# Patient Record
Sex: Male | Born: 1975 | Race: White | Hispanic: No | Marital: Single | Smoking: Former smoker
Health system: Southern US, Community
[De-identification: ages and names within clinical notes are randomized; demographics above are authoritative.]

---

## 2000-06-17 ENCOUNTER — Emergency Department (HOSPITAL_COMMUNITY): Admission: EM | Admit: 2000-06-17 | Discharge: 2000-06-17 | Payer: Self-pay | Admitting: Emergency Medicine

## 2000-06-17 ENCOUNTER — Encounter: Payer: Self-pay | Admitting: Emergency Medicine

## 2004-01-14 ENCOUNTER — Emergency Department (HOSPITAL_COMMUNITY): Admission: AD | Admit: 2004-01-14 | Discharge: 2004-01-14 | Payer: Self-pay | Admitting: Family Medicine

## 2004-11-08 ENCOUNTER — Emergency Department (HOSPITAL_COMMUNITY): Admission: EM | Admit: 2004-11-08 | Discharge: 2004-11-08 | Payer: Self-pay | Admitting: Family Medicine

## 2004-11-13 ENCOUNTER — Ambulatory Visit: Payer: Self-pay

## 2004-11-22 ENCOUNTER — Ambulatory Visit: Payer: Self-pay

## 2010-08-01 ENCOUNTER — Ambulatory Visit: Payer: Self-pay | Admitting: Family Medicine

## 2011-07-18 ENCOUNTER — Inpatient Hospital Stay (INDEPENDENT_AMBULATORY_CARE_PROVIDER_SITE_OTHER)
Admission: RE | Admit: 2011-07-18 | Discharge: 2011-07-18 | Disposition: A | Discharge status: Home / Self Care | Payer: Self-pay | Source: Ambulatory Visit | Attending: Family Medicine | Admitting: Family Medicine

## 2011-07-18 ENCOUNTER — Emergency Department (HOSPITAL_COMMUNITY): Payer: Self-pay

## 2011-07-18 ENCOUNTER — Emergency Department (HOSPITAL_COMMUNITY)
Admission: EM | Admit: 2011-07-18 | Discharge: 2011-07-19 | Disposition: A | Discharge status: Home / Self Care | Payer: Self-pay | Attending: Emergency Medicine | Admitting: Emergency Medicine

## 2011-07-18 DIAGNOSIS — K5900 Constipation, unspecified: Secondary | ICD-10-CM | POA: Insufficient documentation

## 2011-07-18 DIAGNOSIS — R1032 Left lower quadrant pain: Secondary | ICD-10-CM | POA: Insufficient documentation

## 2011-07-18 DIAGNOSIS — R10814 Left lower quadrant abdominal tenderness: Secondary | ICD-10-CM

## 2011-07-18 DIAGNOSIS — R197 Diarrhea, unspecified: Secondary | ICD-10-CM | POA: Insufficient documentation

## 2011-07-18 LAB — DIFFERENTIAL
Basophils Absolute: 0 10*3/uL (ref 0.0–0.1)
Basophils Relative: 0 % (ref 0–1)
Eosinophils Absolute: 0.2 10*3/uL (ref 0.0–0.7)
Eosinophils Relative: 1 % (ref 0–5)
Lymphocytes Relative: 25 % (ref 12–46)
Lymphs Abs: 2.8 10*3/uL (ref 0.7–4.0)
Monocytes Absolute: 1 10*3/uL (ref 0.1–1.0)
Monocytes Relative: 9 % (ref 3–12)
Neutro Abs: 7.2 10*3/uL (ref 1.7–7.7)
Neutrophils Relative %: 64 % (ref 43–77)

## 2011-07-18 LAB — URINALYSIS, ROUTINE W REFLEX MICROSCOPIC
Bilirubin Urine: NEGATIVE
Glucose, UA: NEGATIVE mg/dL
Hgb urine dipstick: NEGATIVE
Ketones, ur: 15 mg/dL — AB
Leukocytes, UA: NEGATIVE
Nitrite: NEGATIVE
Protein, ur: NEGATIVE mg/dL
Specific Gravity, Urine: 1.018 (ref 1.005–1.030)
Urobilinogen, UA: 0.2 mg/dL (ref 0.0–1.0)
pH: 5.5 (ref 5.0–8.0)

## 2011-07-18 LAB — CBC
HCT: 42.1 % (ref 39.0–52.0)
Hemoglobin: 15 g/dL (ref 13.0–17.0)
MCH: 33.3 pg (ref 26.0–34.0)
MCHC: 35.6 g/dL (ref 30.0–36.0)
MCV: 93.6 fL (ref 78.0–100.0)
Platelets: 265 10*3/uL (ref 150–400)
RBC: 4.5 MIL/uL (ref 4.22–5.81)
RDW: 11.8 % (ref 11.5–15.5)
WBC: 11.2 10*3/uL — ABNORMAL HIGH (ref 4.0–10.5)

## 2011-07-18 LAB — BASIC METABOLIC PANEL
BUN: 12 mg/dL (ref 6–23)
CO2: 28 mEq/L (ref 19–32)
Calcium: 9.6 mg/dL (ref 8.4–10.5)
Chloride: 102 mEq/L (ref 96–112)
Creatinine, Ser: 0.73 mg/dL (ref 0.50–1.35)
GFR calc Af Amer: >60 mL/min (ref >60)
GFR calc non Af Amer: >60 mL/min (ref >60)
Glucose, Bld: 87 mg/dL (ref 70–99)
Potassium: 4 mEq/L (ref 3.5–5.1)
Sodium: 140 mEq/L (ref 135–145)

## 2011-07-19 MED ORDER — IOHEXOL 300 MG/ML  SOLN
100.0000 mL | Freq: Once | INTRAMUSCULAR | Status: AC | PRN
  Administered 2011-07-19: 100 mL via INTRAVENOUS

## 2019-01-24 ENCOUNTER — Emergency Department (HOSPITAL_COMMUNITY)
Admission: EM | Admit: 2019-01-24 | Discharge: 2019-01-25 | Payer: No Typology Code available for payment source | Attending: Emergency Medicine | Admitting: Emergency Medicine

## 2019-01-24 ENCOUNTER — Other Ambulatory Visit: Payer: Self-pay

## 2019-01-24 ENCOUNTER — Encounter (HOSPITAL_COMMUNITY): Payer: Self-pay | Admitting: Emergency Medicine

## 2019-01-24 DIAGNOSIS — Z23 Encounter for immunization: Secondary | ICD-10-CM | POA: Insufficient documentation

## 2019-01-24 DIAGNOSIS — F10920 Alcohol use, unspecified with intoxication, uncomplicated: Secondary | ICD-10-CM

## 2019-01-24 DIAGNOSIS — S01111A Laceration without foreign body of right eyelid and periocular area, initial encounter: Secondary | ICD-10-CM | POA: Diagnosis not present

## 2019-01-24 DIAGNOSIS — Y9389 Activity, other specified: Secondary | ICD-10-CM | POA: Diagnosis not present

## 2019-01-24 DIAGNOSIS — S63501A Unspecified sprain of right wrist, initial encounter: Secondary | ICD-10-CM

## 2019-01-24 DIAGNOSIS — Y999 Unspecified external cause status: Secondary | ICD-10-CM | POA: Insufficient documentation

## 2019-01-24 DIAGNOSIS — S0990XA Unspecified injury of head, initial encounter: Secondary | ICD-10-CM

## 2019-01-24 DIAGNOSIS — S0181XA Laceration without foreign body of other part of head, initial encounter: Secondary | ICD-10-CM

## 2019-01-24 DIAGNOSIS — Y92410 Unspecified street and highway as the place of occurrence of the external cause: Secondary | ICD-10-CM | POA: Diagnosis not present

## 2019-01-24 DIAGNOSIS — S098XXA Other specified injuries of head, initial encounter: Secondary | ICD-10-CM | POA: Diagnosis present
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
## 2019-01-24 NOTE — ED Notes (Signed)
Bed: WTR6 Expected date:  Expected time:  Means of arrival:  Comments: 

## 2019-01-25 MED ORDER — TETANUS-DIPHTH-ACELL PERTUSSIS 5-2.5-18.5 LF-MCG/0.5 IM SUSP
0.5000 mL | Freq: Once | INTRAMUSCULAR | Status: AC
  Administered 2019-01-25: 0.5 mL via INTRAMUSCULAR
  Filled 2019-01-25: qty 0.5

## 2019-01-25 MED ORDER — LIDOCAINE-EPINEPHRINE (PF) 2 %-1:200000 IJ SOLN
10.0000 mL | Freq: Once | INTRAMUSCULAR | Status: AC
  Administered 2019-01-25: 20 mL via INTRADERMAL
  Filled 2019-01-25: qty 20

## 2019-01-25 NOTE — Discharge Instructions (Signed)
You may alternate Tylenol 1000 mg every 6 hours as needed for pain and Ibuprofen 800 mg every 8 hours as needed for pain.  Please take Ibuprofen with food.  We have offered you an x-ray of your right wrist.  You have declined x-rays today to evaluate for possible fracture.  If pain is not improving with rest, elevation, ice, Tylenol and ibuprofen, return to the emergency department, urgent care or your primary care physician for x-rays.
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
## 2019-03-27 ENCOUNTER — Emergency Department (HOSPITAL_COMMUNITY): Payer: Self-pay

## 2019-03-27 ENCOUNTER — Encounter (HOSPITAL_COMMUNITY): Payer: Self-pay

## 2019-03-27 ENCOUNTER — Other Ambulatory Visit: Payer: Self-pay

## 2019-03-27 ENCOUNTER — Emergency Department (HOSPITAL_COMMUNITY): Payer: Self-pay | Admitting: Anesthesiology

## 2019-03-27 ENCOUNTER — Encounter (HOSPITAL_COMMUNITY)
Admission: EM | Disposition: A | Discharge status: Home / Self Care | Payer: Self-pay | Source: Home / Self Care | Attending: Emergency Medicine

## 2019-03-27 ENCOUNTER — Ambulatory Visit (HOSPITAL_COMMUNITY)
Admission: EM | Admit: 2019-03-27 | Discharge: 2019-03-27 | Disposition: A | Discharge status: Home / Self Care | Payer: Self-pay | Attending: Surgery | Admitting: Surgery

## 2019-03-27 DIAGNOSIS — D176 Benign lipomatous neoplasm of spermatic cord: Secondary | ICD-10-CM | POA: Insufficient documentation

## 2019-03-27 DIAGNOSIS — Z87891 Personal history of nicotine dependence: Secondary | ICD-10-CM | POA: Insufficient documentation

## 2019-03-27 DIAGNOSIS — K4030 Unilateral inguinal hernia, with obstruction, without gangrene, not specified as recurrent: Secondary | ICD-10-CM | POA: Insufficient documentation

## 2019-03-27 DIAGNOSIS — Z79899 Other long term (current) drug therapy: Secondary | ICD-10-CM | POA: Insufficient documentation

## 2019-03-27 DIAGNOSIS — K4190 Unilateral femoral hernia, without obstruction or gangrene, not specified as recurrent: Secondary | ICD-10-CM | POA: Insufficient documentation

## 2019-03-27 DIAGNOSIS — K56609 Unspecified intestinal obstruction, unspecified as to partial versus complete obstruction: Secondary | ICD-10-CM

## 2019-03-27 DIAGNOSIS — Z791 Long term (current) use of non-steroidal anti-inflammatories (NSAID): Secondary | ICD-10-CM | POA: Insufficient documentation

## 2019-03-27 HISTORY — PX: INSERTION OF MESH: SHX5868

## 2019-03-27 HISTORY — PX: FEMORAL HERNIA REPAIR: SHX632

## 2019-03-27 HISTORY — PX: INGUINAL HERNIA REPAIR: SHX194

## 2019-03-27 HISTORY — PX: LAPAROSCOPIC TRANSEXTRAPERITONEAL INGUINAL HERNIA REPAIR: SUR801

## 2019-03-27 LAB — BASIC METABOLIC PANEL
Anion gap: 12 (ref 5–15)
BUN: 19 mg/dL (ref 6–20)
CO2: 24 mmol/L (ref 22–32)
Calcium: 9.1 mg/dL (ref 8.9–10.3)
Chloride: 104 mmol/L (ref 98–111)
Creatinine, Ser: 0.74 mg/dL (ref 0.61–1.24)
GFR calc Af Amer: >60 mL/min (ref >60)
GFR calc non Af Amer: >60 mL/min (ref >60)
Glucose, Bld: 155 mg/dL — ABNORMAL HIGH (ref 70–99)
Potassium: 3.8 mmol/L (ref 3.5–5.1)
Sodium: 140 mmol/L (ref 135–145)

## 2019-03-27 LAB — CBC WITH DIFFERENTIAL/PLATELET
Abs Immature Granulocytes: 0.05 10*3/uL (ref 0.00–0.07)
Basophils Absolute: 0 10*3/uL (ref 0.0–0.1)
Basophils Relative: 0 %
Eosinophils Absolute: 0 10*3/uL (ref 0.0–0.5)
Eosinophils Relative: 0 %
HCT: 40.1 % (ref 39.0–52.0)
Hemoglobin: 13.5 g/dL (ref 13.0–17.0)
Immature Granulocytes: 0 %
Lymphocytes Relative: 6 %
Lymphs Abs: 0.8 10*3/uL (ref 0.7–4.0)
MCH: 34.4 pg — ABNORMAL HIGH (ref 26.0–34.0)
MCHC: 33.7 g/dL (ref 30.0–36.0)
MCV: 102.3 fL — ABNORMAL HIGH (ref 80.0–100.0)
Monocytes Absolute: 0.6 10*3/uL (ref 0.1–1.0)
Monocytes Relative: 5 %
Neutro Abs: 10.5 10*3/uL — ABNORMAL HIGH (ref 1.7–7.7)
Neutrophils Relative %: 89 %
Platelets: 238 10*3/uL (ref 150–400)
RBC: 3.92 MIL/uL — ABNORMAL LOW (ref 4.22–5.81)
RDW: 11.6 % (ref 11.5–15.5)
WBC: 12 10*3/uL — ABNORMAL HIGH (ref 4.0–10.5)
nRBC: 0 % (ref 0.0–0.2)

## 2019-03-27 LAB — LACTIC ACID, PLASMA: Lactic Acid, Venous: 0.9 mmol/L (ref 0.5–1.9)

## 2019-03-27 SURGERY — REPAIR, HERNIA, INGUINAL, LAPAROSCOPIC
Anesthesia: General | Site: Abdomen | Laterality: Right

## 2019-03-27 MED ORDER — CEFAZOLIN SODIUM-DEXTROSE 2-4 GM/100ML-% IV SOLN
2.0000 g | INTRAVENOUS | Status: AC
  Administered 2019-03-27: 2 g via INTRAVENOUS
  Filled 2019-03-27: qty 100

## 2019-03-27 MED ORDER — ONDANSETRON 4 MG PO TBDP: 4.0000 mg | ORAL_TABLET | Freq: Four times a day (QID) | ORAL | Status: DC | PRN

## 2019-03-27 MED ORDER — TRAMADOL HCL 50 MG PO TABS: 50.0000 mg | ORAL_TABLET | Freq: Four times a day (QID) | ORAL | Status: DC | PRN

## 2019-03-27 MED ORDER — METRONIDAZOLE IN NACL 5-0.79 MG/ML-% IV SOLN
INTRAVENOUS | Status: AC
  Filled 2019-03-27: qty 100

## 2019-03-27 MED ORDER — 0.9 % SODIUM CHLORIDE (POUR BTL) OPTIME
TOPICAL | Status: DC | PRN
  Administered 2019-03-27: 1000 mL

## 2019-03-27 MED ORDER — SUCCINYLCHOLINE CHLORIDE 200 MG/10ML IV SOSY
PREFILLED_SYRINGE | INTRAVENOUS | Status: AC
  Filled 2019-03-27: qty 10

## 2019-03-27 MED ORDER — ONDANSETRON HCL 4 MG/2ML IJ SOLN
INTRAMUSCULAR | Status: AC
  Filled 2019-03-27: qty 2

## 2019-03-27 MED ORDER — DIPHENHYDRAMINE HCL 12.5 MG/5ML PO ELIX: 12.5000 mg | ORAL_SOLUTION | Freq: Four times a day (QID) | ORAL | Status: DC | PRN

## 2019-03-27 MED ORDER — SIMETHICONE 80 MG PO CHEW: 40.0000 mg | CHEWABLE_TABLET | Freq: Four times a day (QID) | ORAL | Status: DC | PRN

## 2019-03-27 MED ORDER — LACTATED RINGERS IV BOLUS: 1000.0000 mL | Freq: Three times a day (TID) | INTRAVENOUS | Status: DC | PRN

## 2019-03-27 MED ORDER — ROCURONIUM BROMIDE 10 MG/ML (PF) SYRINGE
PREFILLED_SYRINGE | INTRAVENOUS | Status: DC | PRN
  Administered 2019-03-27 (×2): 10 mg via INTRAVENOUS
  Administered 2019-03-27: 40 mg via INTRAVENOUS
  Administered 2019-03-27 (×2): 10 mg via INTRAVENOUS

## 2019-03-27 MED ORDER — LIDOCAINE 2% (20 MG/ML) 5 ML SYRINGE
INTRAMUSCULAR | Status: DC | PRN
  Administered 2019-03-27: 100 mg via INTRAVENOUS

## 2019-03-27 MED ORDER — POLYETHYLENE GLYCOL 3350 17 G PO PACK: 17.0000 g | PACK | Freq: Every day | ORAL | Status: DC | PRN

## 2019-03-27 MED ORDER — HYDROMORPHONE HCL 1 MG/ML IJ SOLN
1.0000 mg | Freq: Once | INTRAMUSCULAR | Status: AC
  Administered 2019-03-27: 1 mg via INTRAVENOUS
  Filled 2019-03-27: qty 1

## 2019-03-27 MED ORDER — PROPOFOL 10 MG/ML IV BOLUS
INTRAVENOUS | Status: AC
  Filled 2019-03-27: qty 20

## 2019-03-27 MED ORDER — SODIUM CHLORIDE 0.9% FLUSH: 3.0000 mL | Freq: Two times a day (BID) | INTRAVENOUS | Status: DC

## 2019-03-27 MED ORDER — SUGAMMADEX SODIUM 200 MG/2ML IV SOLN
INTRAVENOUS | Status: DC | PRN
  Administered 2019-03-27: 150 mg via INTRAVENOUS

## 2019-03-27 MED ORDER — STERILE WATER FOR IRRIGATION IR SOLN
Status: DC | PRN
  Administered 2019-03-27: 1000 mL

## 2019-03-27 MED ORDER — GABAPENTIN 300 MG PO CAPS
300.0000 mg | ORAL_CAPSULE | Freq: Two times a day (BID) | ORAL | Status: DC
  Administered 2019-03-27: 300 mg via ORAL
  Filled 2019-03-27: qty 1

## 2019-03-27 MED ORDER — METRONIDAZOLE IN NACL 5-0.79 MG/ML-% IV SOLN
500.0000 mg | Freq: Once | INTRAVENOUS | Status: AC
  Administered 2019-03-27: 500 mg via INTRAVENOUS
  Filled 2019-03-27: qty 100

## 2019-03-27 MED ORDER — METOPROLOL TARTRATE 5 MG/5ML IV SOLN: 5.0000 mg | Freq: Four times a day (QID) | INTRAVENOUS | Status: DC | PRN

## 2019-03-27 MED ORDER — IOHEXOL 300 MG/ML  SOLN
100.0000 mL | Freq: Once | INTRAMUSCULAR | Status: AC | PRN
  Administered 2019-03-27: 100 mL via INTRAVENOUS

## 2019-03-27 MED ORDER — ONDANSETRON HCL 4 MG/2ML IJ SOLN: 4.0000 mg | Freq: Four times a day (QID) | INTRAMUSCULAR | Status: DC | PRN

## 2019-03-27 MED ORDER — ENOXAPARIN SODIUM 40 MG/0.4ML ~~LOC~~ SOLN: 40.0000 mg | SUBCUTANEOUS | Status: DC

## 2019-03-27 MED ORDER — LACTATED RINGERS IV SOLN
INTRAVENOUS | Status: DC
  Administered 2019-03-27: 08:00:00 via INTRAVENOUS

## 2019-03-27 MED ORDER — HYDRALAZINE HCL 20 MG/ML IJ SOLN: 5.0000 mg | INTRAMUSCULAR | Status: DC | PRN

## 2019-03-27 MED ORDER — FENTANYL CITRATE (PF) 250 MCG/5ML IJ SOLN
INTRAMUSCULAR | Status: DC | PRN
  Administered 2019-03-27 (×4): 50 ug via INTRAVENOUS
  Administered 2019-03-27: 150 ug via INTRAVENOUS

## 2019-03-27 MED ORDER — SODIUM CHLORIDE 0.9% FLUSH: 3.0000 mL | INTRAVENOUS | Status: DC | PRN

## 2019-03-27 MED ORDER — BISACODYL 10 MG RE SUPP: 10.0000 mg | Freq: Every day | RECTAL | Status: DC | PRN

## 2019-03-27 MED ORDER — NAPROXEN 500 MG PO TABS: 500.0000 mg | ORAL_TABLET | Freq: Two times a day (BID) | ORAL | 1 refills | Status: AC | PRN

## 2019-03-27 MED ORDER — PROCHLORPERAZINE EDISYLATE 10 MG/2ML IJ SOLN: 5.0000 mg | Freq: Four times a day (QID) | INTRAMUSCULAR | Status: DC | PRN

## 2019-03-27 MED ORDER — ONDANSETRON HCL 4 MG/2ML IJ SOLN
4.0000 mg | Freq: Once | INTRAMUSCULAR | Status: AC
  Administered 2019-03-27: 4 mg via INTRAVENOUS
  Filled 2019-03-27: qty 2

## 2019-03-27 MED ORDER — HYDROMORPHONE HCL 1 MG/ML IJ SOLN
INTRAMUSCULAR | Status: AC
  Administered 2019-03-27: 12:00:00 1 mg via INTRAVENOUS
  Filled 2019-03-27: qty 1

## 2019-03-27 MED ORDER — ACETAMINOPHEN 500 MG PO TABS
1000.0000 mg | ORAL_TABLET | Freq: Three times a day (TID) | ORAL | Status: DC
  Administered 2019-03-27: 15:00:00 1000 mg via ORAL
  Filled 2019-03-27: qty 2

## 2019-03-27 MED ORDER — METHOCARBAMOL 500 MG PO TABS: 750.0000 mg | ORAL_TABLET | Freq: Four times a day (QID) | ORAL | Status: DC | PRN

## 2019-03-27 MED ORDER — MAGIC MOUTHWASH
15.0000 mL | Freq: Four times a day (QID) | ORAL | Status: DC | PRN
  Filled 2019-03-27: qty 15

## 2019-03-27 MED ORDER — PROPOFOL 10 MG/ML IV BOLUS
INTRAVENOUS | Status: DC | PRN
  Administered 2019-03-27: 140 mg via INTRAVENOUS

## 2019-03-27 MED ORDER — LACTATED RINGERS IR SOLN
Status: DC | PRN
  Administered 2019-03-27: 1000 mL

## 2019-03-27 MED ORDER — SUGAMMADEX SODIUM 200 MG/2ML IV SOLN
INTRAVENOUS | Status: AC
  Filled 2019-03-27: qty 2

## 2019-03-27 MED ORDER — HYDROMORPHONE HCL 1 MG/ML IJ SOLN
0.2500 mg | INTRAMUSCULAR | Status: DC | PRN
  Administered 2019-03-27 (×2): 0.5 mg via INTRAVENOUS

## 2019-03-27 MED ORDER — DEXAMETHASONE SODIUM PHOSPHATE 10 MG/ML IJ SOLN
INTRAMUSCULAR | Status: AC
  Filled 2019-03-27: qty 1

## 2019-03-27 MED ORDER — BUPIVACAINE LIPOSOME 1.3 % IJ SUSP
20.0000 mL | Freq: Once | INTRAMUSCULAR | Status: AC
  Administered 2019-03-27: 10:00:00 20 mL
  Filled 2019-03-27: qty 20

## 2019-03-27 MED ORDER — BUPIVACAINE-EPINEPHRINE 0.25% -1:200000 IJ SOLN
INTRAMUSCULAR | Status: DC | PRN
  Administered 2019-03-27: 60 mL

## 2019-03-27 MED ORDER — LIDOCAINE 2% (20 MG/ML) 5 ML SYRINGE
INTRAMUSCULAR | Status: AC
  Filled 2019-03-27: qty 5

## 2019-03-27 MED ORDER — BUPIVACAINE-EPINEPHRINE (PF) 0.25% -1:200000 IJ SOLN
INTRAMUSCULAR | Status: AC
  Filled 2019-03-27: qty 30

## 2019-03-27 MED ORDER — OXYCODONE HCL 5 MG PO TABS
5.0000 mg | ORAL_TABLET | ORAL | Status: DC | PRN
  Administered 2019-03-27: 5 mg via ORAL
  Filled 2019-03-27: qty 1

## 2019-03-27 MED ORDER — ROCURONIUM BROMIDE 10 MG/ML (PF) SYRINGE
PREFILLED_SYRINGE | INTRAVENOUS | Status: AC
  Filled 2019-03-27: qty 10

## 2019-03-27 MED ORDER — SUCCINYLCHOLINE CHLORIDE 200 MG/10ML IV SOSY
PREFILLED_SYRINGE | INTRAVENOUS | Status: DC | PRN
  Administered 2019-03-27: 120 mg via INTRAVENOUS

## 2019-03-27 MED ORDER — MIDAZOLAM HCL 2 MG/2ML IJ SOLN
INTRAMUSCULAR | Status: AC
  Filled 2019-03-27: qty 2

## 2019-03-27 MED ORDER — SODIUM CHLORIDE (PF) 0.9 % IJ SOLN
INTRAMUSCULAR | Status: AC
  Filled 2019-03-27: qty 50

## 2019-03-27 MED ORDER — MIDAZOLAM HCL 2 MG/2ML IJ SOLN
INTRAMUSCULAR | Status: DC | PRN
  Administered 2019-03-27: 2 mg via INTRAVENOUS

## 2019-03-27 MED ORDER — HYDROMORPHONE HCL 1 MG/ML IJ SOLN
1.0000 mg | Freq: Once | INTRAMUSCULAR | Status: AC
  Administered 2019-03-27: 04:00:00 1 mg via INTRAVENOUS
  Filled 2019-03-27: qty 1

## 2019-03-27 MED ORDER — HYDROMORPHONE HCL 1 MG/ML IJ SOLN
0.5000 mg | INTRAMUSCULAR | Status: DC | PRN
  Administered 2019-03-27: 12:00:00 1 mg via INTRAVENOUS
  Filled 2019-03-27: qty 1

## 2019-03-27 MED ORDER — DIPHENHYDRAMINE HCL 50 MG/ML IJ SOLN: 12.5000 mg | Freq: Four times a day (QID) | INTRAMUSCULAR | Status: DC | PRN

## 2019-03-27 MED ORDER — DEXAMETHASONE SODIUM PHOSPHATE 10 MG/ML IJ SOLN
INTRAMUSCULAR | Status: DC | PRN
  Administered 2019-03-27: 10 mg via INTRAVENOUS

## 2019-03-27 MED ORDER — FENTANYL CITRATE (PF) 250 MCG/5ML IJ SOLN
INTRAMUSCULAR | Status: AC
  Filled 2019-03-27: qty 5

## 2019-03-27 MED ORDER — ENALAPRILAT 1.25 MG/ML IV SOLN
0.6250 mg | Freq: Four times a day (QID) | INTRAVENOUS | Status: DC | PRN
  Filled 2019-03-27: qty 1

## 2019-03-27 MED ORDER — TRAMADOL HCL 50 MG PO TABS: 50.0000 mg | ORAL_TABLET | Freq: Four times a day (QID) | ORAL | 0 refills | Status: AC | PRN

## 2019-03-27 MED ORDER — PROCHLORPERAZINE MALEATE 10 MG PO TABS: 10.0000 mg | ORAL_TABLET | Freq: Four times a day (QID) | ORAL | Status: DC | PRN

## 2019-03-27 MED ORDER — SODIUM CHLORIDE 0.9 % IV SOLN: 250.0000 mL | INTRAVENOUS | Status: DC | PRN

## 2019-03-27 MED ORDER — FENTANYL CITRATE (PF) 100 MCG/2ML IJ SOLN
INTRAMUSCULAR | Status: AC
  Filled 2019-03-27: qty 2

## 2019-03-27 MED ORDER — LIP MEDEX EX OINT: 1.0000 "application " | TOPICAL_OINTMENT | Freq: Two times a day (BID) | CUTANEOUS | Status: DC

## 2019-03-27 MED ORDER — ONDANSETRON HCL 4 MG/2ML IJ SOLN
INTRAMUSCULAR | Status: DC | PRN
  Administered 2019-03-27: 4 mg via INTRAVENOUS

## 2019-03-27 SURGICAL SUPPLY — 40 items
APL PRP STRL LF DISP 70% ISPRP (MISCELLANEOUS) ×2
CABLE HIGH FREQUENCY MONO STRZ (ELECTRODE) ×4 IMPLANT
CHLORAPREP W/TINT 26 (MISCELLANEOUS) ×4 IMPLANT
COVER SURGICAL LIGHT HANDLE (MISCELLANEOUS) ×4 IMPLANT
COVER WAND RF STERILE (DRAPES) ×3 IMPLANT
DECANTER SPIKE VIAL GLASS SM (MISCELLANEOUS) ×4 IMPLANT
DEVICE SECURE STRAP 25 ABSORB (INSTRUMENTS) IMPLANT
DRAPE WARM FLUID 44X44 (DRAPE) ×4 IMPLANT
DRSG TEGADERM 2-3/8X2-3/4 SM (GAUZE/BANDAGES/DRESSINGS) ×8 IMPLANT
DRSG TEGADERM 4X4.75 (GAUZE/BANDAGES/DRESSINGS) ×4 IMPLANT
ELECT REM PT RETURN 15FT ADLT (MISCELLANEOUS) ×4 IMPLANT
GAUZE SPONGE 2X2 8PLY STRL LF (GAUZE/BANDAGES/DRESSINGS) ×2 IMPLANT
GLOVE ECLIPSE 8.0 STRL XLNG CF (GLOVE) ×4 IMPLANT
GLOVE INDICATOR 8.0 STRL GRN (GLOVE) ×4 IMPLANT
GOWN STRL REUS W/TWL XL LVL3 (GOWN DISPOSABLE) ×11 IMPLANT
IRRIG SUCT STRYKERFLOW 2 WTIP (MISCELLANEOUS) ×4
IRRIGATION SUCT STRKRFLW 2 WTP (MISCELLANEOUS) ×1 IMPLANT
KIT BASIN OR (CUSTOM PROCEDURE TRAY) ×4 IMPLANT
KIT TURNOVER KIT A (KITS) IMPLANT
MESH ULTRAPRO 6X6 15CM15CM (Mesh General) ×9 IMPLANT
NDL INSUFFLATION 14GA 120MM (NEEDLE) IMPLANT
NEEDLE INSUFFLATION 14GA 120MM (NEEDLE) IMPLANT
PAD POSITIONING PINK XL (MISCELLANEOUS) ×4 IMPLANT
SCISSORS LAP 5X35 DISP (ENDOMECHANICALS) ×4 IMPLANT
SET TUBE SMOKE EVAC HIGH FLOW (TUBING) ×4 IMPLANT
SLEEVE ADV FIXATION 5X100MM (TROCAR) ×1 IMPLANT
SLEEVE XCEL OPT CAN 5 100 (ENDOMECHANICALS) ×3 IMPLANT
SPONGE GAUZE 2X2 STER 10/PKG (GAUZE/BANDAGES/DRESSINGS) ×2
SUT MNCRL AB 4-0 PS2 18 (SUTURE) ×4 IMPLANT
SUT PDS AB 1 CT1 27 (SUTURE) ×8 IMPLANT
SUT VIC AB 2-0 SH 27 (SUTURE) ×4
SUT VIC AB 2-0 SH 27X BRD (SUTURE) ×1 IMPLANT
SUT VICRYL 0 UR6 27IN ABS (SUTURE) ×7 IMPLANT
TACKER 5MM HERNIA 3.5CML NAB (ENDOMECHANICALS) IMPLANT
TOWEL OR 17X26 10 PK STRL BLUE (TOWEL DISPOSABLE) ×4 IMPLANT
TOWEL OR NON WOVEN STRL DISP B (DISPOSABLE) ×4 IMPLANT
TRAY LAPAROSCOPIC (CUSTOM PROCEDURE TRAY) ×4 IMPLANT
TROCAR ADV FIXATION 5X100MM (TROCAR) ×1 IMPLANT
TROCAR XCEL BLUNT TIP 100MML (ENDOMECHANICALS) ×4 IMPLANT
TROCAR XCEL NON-BLD 5MMX100MML (ENDOMECHANICALS) ×3 IMPLANT

## 2019-03-27 NOTE — ED Notes (Signed)
Patient transported to CT 

## 2019-03-27 NOTE — ED Provider Notes (Signed)
[facility] DEPT Provider Note: Charles Spurling, MD, FACEP  CSN: [id number] MRN: [id number] ARRIVAL: 03/27/19 at [place]: [id number]   CHIEF COMPLAINT  Hernia   HISTORY OF PRESENT ILLNESS  03/27/19 1:59 AM Charles Deleon is a 43 y.o. male with a history of a right inguinal hernia for 9 years.  He tried to eat dinner yesterday evening about 8 PM but had a poor appetite.  His abdomen began hurting in about 9 PM he noticed severe pain in his right inguinal region with a tender mass.  This mass is much larger than the usual presentation of his hernia and he has not been able to reduce it.  He has had associated nausea but no vomiting.  He did have some diarrhea around 9:30 PM yesterday evening.  He states he has been exerting himself at work more vigorously than usual recently.   History reviewed. No pertinent past medical history.  History reviewed. No pertinent surgical history.  Family History  Problem Relation Age of Onset   Cancer Mother     Social History   Tobacco Use   Smoking status: Former Smoker   Smokeless tobacco: Never Used  Substance Use Topics   Alcohol use: Not Currently   Drug use: Yes    Types: Marijuana    Prior to Admission medications   Not on File    Allergies Patient has no known allergies.   REVIEW OF SYSTEMS  Negative except as noted here or in the History of Present Illness.   PHYSICAL EXAMINATION  Initial Vital Signs Blood pressure 137/81, pulse 63, temperature 97.7 F (36.5 C), temperature source Oral, resp. rate 18, height 5\' 10"  (1.778 m), weight 70.3 kg, SpO2 97 %.  Examination General: Well-developed, well-nourished male in no acute distress; appearance consistent with age of record HENT: normocephalic; well-healing laceration of medial right eyebrow Eyes: Normal appearance Neck: supple Heart: regular rate and rhythm Lungs: clear to auscultation bilaterally Abdomen: soft; nondistended; tender right inguinal mass;  bowel sounds hyperactive Extremities: No deformity; full range of motion; pulses normal Neurologic: Awake, alert and oriented; motor function intact in all extremities and symmetric; no facial droop Skin: Warm and dry Psychiatric: Grimacing   RESULTS  Summary of this visit's results, reviewed by myself:   EKG Interpretation  Date/Time:    Ventricular Rate:    PR Interval:    QRS Duration:   QT Interval:    QTC Calculation:   R Axis:     Text Interpretation:        Laboratory Studies: Results for orders placed or performed during the hospital encounter of 03/27/19 (from the past 24 hour(s))  CBC with Differential/Platelet     Status: Abnormal   Collection Time: 03/27/19  2:15 AM  Result Value Ref Range   WBC 12.0 (H) 4.0 - 10.5 K/uL   RBC 3.92 (L) 4.22 - 5.81 MIL/uL   Hemoglobin 13.5 13.0 - 17.0 g/dL   HCT 40.1 39.0 - 52.0 %   MCV 102.3 (H) 80.0 - 100.0 fL   MCH 34.4 (H) 26.0 - 34.0 pg   MCHC 33.7 30.0 - 36.0 g/dL   RDW 11.6 11.5 - 15.5 %   Platelets 238 150 - 400 K/uL   nRBC 0.0 0.0 - 0.2 %   Neutrophils Relative % 89 %   Neutro Abs 10.5 (H) 1.7 - 7.7 K/uL   Lymphocytes Relative 6 %   Lymphs Abs 0.8 0.7 - 4.0 K/uL   Monocytes Relative 5 %  Monocytes Absolute 0.6 0.1 - 1.0 K/uL   Eosinophils Relative 0 %   Eosinophils Absolute 0.0 0.0 - 0.5 K/uL   Basophils Relative 0 %   Basophils Absolute 0.0 0.0 - 0.1 K/uL   Immature Granulocytes 0 %   Abs Immature Granulocytes 0.05 0.00 - 0.07 K/uL  Basic metabolic panel     Status: Abnormal   Collection Time: 03/27/19  2:15 AM  Result Value Ref Range   Sodium 140 135 - 145 mmol/L   Potassium 3.8 3.5 - 5.1 mmol/L   Chloride 104 98 - 111 mmol/L   CO2 24 22 - 32 mmol/L   Glucose, Bld 155 (H) 70 - 99 mg/dL   BUN 19 6 - 20 mg/dL   Creatinine, Ser 0.74 0.61 - 1.24 mg/dL   Calcium 9.1 8.9 - 10.3 mg/dL   GFR calc non Af Amer >60 >60 mL/min   GFR calc Af Amer >60 >60 mL/min   Anion gap 12 5 - 15  Lactic acid, plasma      Status: None   Collection Time: 03/27/19  2:15 AM  Result Value Ref Range   Lactic Acid, Venous 0.9 0.5 - 1.9 mmol/L   Imaging Studies: Ct Abdomen Pelvis W Contrast  Result Date: 03/27/2019 CLINICAL DATA:  Mild scattered subsegmental atelectatic changes seen dependently within the visualized lung bases. Visualized lungs are otherwise clear. EXAM: CT ABDOMEN AND PELVIS WITH CONTRAST TECHNIQUE: Multidetector CT imaging of the abdomen and pelvis was performed using the standard protocol following bolus administration of intravenous contrast. CONTRAST:  128mL OMNIPAQUE IOHEXOL 300 MG/ML  SOLN COMPARISON:  Prior CT from 07/18/2011. FINDINGS: Lower chest: Mild scattered subsegmental atelectatic changes seen within the lung bases. Visualized lungs are otherwise clear. Hepatobiliary: Liver demonstrates a normal contrast enhanced appearance. Gallbladder within normal limits. No biliary dilatation. Pancreas: Pancreas within normal limits. Spleen: Spleen within normal limits. Adrenals/Urinary Tract: Adrenal glands are normal. Kidneys equal in size with symmetric enhancement. 12 mm cyst present within the interpolar right kidney. No nephrolithiasis, hydronephrosis, or focal enhancing renal mass. No hydroureter. Partially distended bladder within normal limits. Stomach/Bowel: Stomach within normal limits. Right inguinal hernia containing a short segment loop of small bowel (series 2, image 72). Associated free fluid present within the hernia sac, likely reactive. Small bowel mildly dilated proximally with associated air-fluid level, suggesting associated obstruction. Small bowel is decompressed distal to the hernia. Colon largely decompressed. Normal appendix. No other acute inflammatory changes seen about the bowels. Vascular/Lymphatic: Normal intravascular enhancement seen throughout the intra-abdominal aorta. Mesenteric vessels patent proximally. No adenopathy. Reproductive: Prostate normal. Other: No other free fluid  within the abdomen and pelvis. No free intraperitoneal air. Musculoskeletal: Few remotely healed right-sided rib fractures noted. No acute osseous abnormality. No discrete lytic or blastic osseous lesions. 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 IMPRESSION: 1. Right inguinal hernia containing a short segment of small bowel with associated small bowel obstruction as above. 2. No other acute intra-abdominal or pelvic process. Electronically Signed   By: Jeannine Boga M.D.   On: 03/27/2019 03:53    ED COURSE and MDM  Nursing notes and initial vitals signs, including pulse oximetry, reviewed.  Vitals:   03/27/19 0156 03/27/19 0157  BP: 137/81   Pulse: 63   Resp: 18   Temp: 97.7 F (36.5 C)   TempSrc: Oral   SpO2: 97%   Weight:  70.3 kg  Height:  5\' 10"  (1.778 m)   Discussed with Dr. Barry Dienes of  general surgery who will arrange for patient to be seen, with anticipation of surgery later this morning.  PROCEDURES    ED DIAGNOSES     ICD-10-CM   1. Inguinal hernia of right side with obstruction K40.30        Lulia Schriner, Jenny Reichmann, MD 03/27/19 [phone]

## 2019-03-27 NOTE — ED Triage Notes (Signed)
Patient coming from home with complaints of right groin swelling and pain. Patient states that he has had a hernia for the past 9 years, but recently exerted himself at work and has had increased swelling and pain starting around 10 PM tonight. Patient also states that he has had nausea and diarrhea starting tonight around 9:30 PM.

## 2019-03-27 NOTE — Anesthesia Procedure Notes (Signed)
Procedure Name: Intubation Date/Time: 03/27/2019 8:19 AM Performed by: Sharlette Dense, CRNA Patient Re-evaluated:Patient Re-evaluated prior to induction Oxygen Delivery Method: Circle system utilized Preoxygenation: Pre-oxygenation with 100% oxygen Induction Type: IV induction and Rapid sequence Laryngoscope Size: Miller and 3 Grade View: Grade III Tube type: Oral Tube size: 8.0 mm Number of attempts: 1 Airway Equipment and Method: Stylet Placement Confirmation: positive ETCO2,  ETT inserted through vocal cords under direct vision and breath sounds checked- equal and bilateral Secured at: 22 cm Tube secured with: Tape Dental Injury: Teeth and Oropharynx as per pre-operative assessment  Comments: Anterior with large epiglottis

## 2019-03-27 NOTE — Interval H&P Note (Signed)
History and Physical Interval Note:  03/27/2019 8:10 AM  [place]  has presented today for surgery, with the diagnosis of RIGHT INGUINAL HERNIA.  The various methods of treatment have been discussed with the patient and family. After consideration of risks, benefits and other options for treatment, the patient has consented to  Procedure(s): LAPAROSCOPIC RIGHT  INGUINAL HERNIA POSSIBLE OPEN (Right) as a surgical intervention.  The patient's history has been reviewed, patient examined, no change in status, stable for surgery.  I have reviewed the patient's chart and labs.  Questions were answered to the patient's satisfaction.    The anatomy & physiology of the abdominal wall and pelvic floor was discussed.  The pathophysiology of hernias in the inguinal and pelvic region was discussed.  Natural history risks such as progressive enlargement, pain, incarceration, and strangulation was discussed.   Contributors to complications such as smoking, obesity, diabetes, prior surgery, etc were discussed.    I feel the risks of no intervention will lead to serious problems that outweigh the operative risks; therefore, I recommended surgery to reduce and repair the hernia.  I explained laparoscopic techniques with possible need for an open approach.  I noted usual use of mesh to patch and/or buttress hernia repair  Risks such as bleeding, infection, abscess, need for further treatment, injury to other organs, need for repair of tissues / organs, stroke, heart attack, death, and other risks were discussed.  I noted a good likelihood this will help address the problem.   Goals of post-operative recovery were discussed as well.  Possibility that this will not correct all symptoms was explained.  I stressed the importance of low-impact activity, aggressive pain control, avoiding constipation, & not pushing through pain to minimize risk of post-operative chronic pain or injury. Possibility of reherniation was  discussed.  We will work to minimize complications.     An educational handout further explaining the pathology & treatment options was given as well.  Questions were answered.  The patient expresses understanding & wishes to proceed with surgery.  I have re-reviewed the the patient's records, history, medications, and allergies.  I have re-examined the patient.  I again discussed intraoperative plans and goals of post-operative recovery.  The patient agrees to proceed.  Charles Deleon [place]  25-Dec-1975 [id number]  Patient Care Team: Patient, No Pcp Per as PCP - General (General Practice)  Patient Active Problem List   Diagnosis Date Noted  . Incarcerated right inguinal hernia 03/27/2019  . SBO (small bowel obstruction) ([place]) 03/27/2019    History reviewed. No pertinent past medical history.  History reviewed. No pertinent surgical history.  Social History   Socioeconomic History  . Marital status: Single    Spouse name: Not on file  . Number of children: Not on file  . Years of education: Not on file  . Highest education level: Not on file  Occupational History  . Not on file  Social Needs  . Financial resource strain: Not on file  . Food insecurity:    Worry: Not on file    Inability: Not on file  . Transportation needs:    Medical: Not on file    Non-medical: Not on file  Tobacco Use  . Smoking status: Former Research scientist (life sciences)  . Smokeless tobacco: Never Used  Substance and Sexual Activity  . Alcohol use: Not Currently  . Drug use: Yes    Types: Marijuana    Comment: 1 joint a day  . Sexual activity: Not on file  Lifestyle  . Physical activity:    Days per week: Not on file    Minutes per session: Not on file  . Stress: Not on file  Relationships  . Social connections:    Talks on phone: Not on file    Gets together: Not on file    Attends religious service: Not on file    Active member of club or organization: Not on file    Attends meetings of clubs or  organizations: Not on file    Relationship status: Not on file  . Intimate partner violence:    Fear of current or ex partner: Not on file    Emotionally abused: Not on file    Physically abused: Not on file    Forced sexual activity: Not on file  Other Topics Concern  . Not on file  Social History Narrative  . Not on file    Family History  Problem Relation Age of Onset  . Cancer Mother     Medications Prior to Admission  Medication Sig Dispense Refill Last Dose  . acidophilus (RISAQUAD) CAPS capsule Take 1 capsule by mouth daily.   03/26/2019 at Unknown time  . ibuprofen (ADVIL,MOTRIN) 200 MG tablet Take 800 mg by mouth every 6 (six) hours as needed for moderate pain.   03/26/2019 at Unknown time  . Multiple Vitamin (MULTIVITAMIN WITH MINERALS) TABS tablet Take 1 tablet by mouth daily.   03/26/2019 at Unknown time    Current Facility-Administered Medications  Medication Dose Route Frequency Provider Last Rate Last Dose  . [MAR Hold] ceFAZolin (ANCEF) IVPB 2g/100 mL premix  2 g Intravenous On Call to Elizabeth, MD      . lactated ringers infusion   Intravenous Continuous Suzette Battiest, MD 10 mL/hr at 03/27/19 0801    . sodium chloride (PF) 0.9 % injection              No Known Allergies  BP 132/76   Pulse (!) 57   Temp 98.7 F (37.1 C) (Oral)   Resp 18   Ht 5\' 10"  (1.778 m)   Wt 70.3 kg   SpO2 97%   BMI 22.24 kg/m   Labs: Results for orders placed or performed during the hospital encounter of 03/27/19 (from the past 48 hour(s))  CBC with Differential/Platelet     Status: Abnormal   Collection Time: 03/27/19  2:15 AM  Result Value Ref Range   WBC 12.0 (H) 4.0 - 10.5 K/uL   RBC 3.92 (L) 4.22 - 5.81 MIL/uL   Hemoglobin 13.5 13.0 - 17.0 g/dL   HCT 40.1 39.0 - 52.0 %   MCV 102.3 (H) 80.0 - 100.0 fL   MCH 34.4 (H) 26.0 - 34.0 pg   MCHC 33.7 30.0 - 36.0 g/dL   RDW 11.6 11.5 - 15.5 %   Platelets 238 150 - 400 K/uL   nRBC 0.0 0.0 - 0.2 %   Neutrophils  Relative % 89 %   Neutro Abs 10.5 (H) 1.7 - 7.7 K/uL   Lymphocytes Relative 6 %   Lymphs Abs 0.8 0.7 - 4.0 K/uL   Monocytes Relative 5 %   Monocytes Absolute 0.6 0.1 - 1.0 K/uL   Eosinophils Relative 0 %   Eosinophils Absolute 0.0 0.0 - 0.5 K/uL   Basophils Relative 0 %   Basophils Absolute 0.0 0.0 - 0.1 K/uL   Immature Granulocytes 0 %   Abs Immature Granulocytes 0.05 0.00 - 0.07 K/uL    Comment:  Performed at [facility], [place] [address] [place], [place] [address]  Basic metabolic panel     Status: Abnormal   Collection Time: 03/27/19  2:15 AM  Result Value Ref Range   Sodium 140 135 - 145 mmol/L   Potassium 3.8 3.5 - 5.1 mmol/L   Chloride 104 98 - 111 mmol/L   CO2 24 22 - 32 mmol/L   Glucose, Bld 155 (H) 70 - 99 mg/dL   BUN 19 6 - 20 mg/dL   Creatinine, Ser 0.74 0.61 - 1.24 mg/dL   Calcium 9.1 8.9 - 10.3 mg/dL   GFR calc non Af Amer >60 >60 mL/min   GFR calc Af Amer >60 >60 mL/min   Anion gap 12 5 - 15    Comment: Performed at [facility], [place] [address], [place], [place] [address]  Lactic acid, plasma     Status: None   Collection Time: 03/27/19  2:15 AM  Result Value Ref Range   Lactic Acid, Venous 0.9 0.5 - 1.9 mmol/L    Comment: Performed at [facility], [place] [address], [place], [place] [address]    Imaging / Studies: Ct Abdomen Pelvis W Contrast  Result Date: 03/27/2019 CLINICAL DATA:  Mild scattered subsegmental atelectatic changes seen dependently within the visualized lung bases. Visualized lungs are otherwise clear. EXAM: CT ABDOMEN AND PELVIS WITH CONTRAST TECHNIQUE: Multidetector CT imaging of the abdomen and pelvis was performed using the standard protocol following bolus administration of intravenous contrast. CONTRAST:  145mL OMNIPAQUE IOHEXOL 300 MG/ML  SOLN COMPARISON:  Prior CT from 07/18/2011. FINDINGS: Lower chest: Mild scattered subsegmental atelectatic changes seen within the lung bases.  Visualized lungs are otherwise clear. Hepatobiliary: Liver demonstrates a normal contrast enhanced appearance. Gallbladder within normal limits. No biliary dilatation. Pancreas: Pancreas within normal limits. Spleen: Spleen within normal limits. Adrenals/Urinary Tract: Adrenal glands are normal. Kidneys equal in size with symmetric enhancement. 12 mm cyst present within the interpolar right kidney. No nephrolithiasis, hydronephrosis, or focal enhancing renal mass. No hydroureter. Partially distended bladder within normal limits. Stomach/Bowel: Stomach within normal limits. Right inguinal hernia containing a short segment loop of small bowel (series 2, image 72). Associated free fluid present within the hernia sac, likely reactive. Small bowel mildly dilated proximally with associated air-fluid level, suggesting associated obstruction. Small bowel is decompressed distal to the hernia. Colon largely decompressed. Normal appendix. No other acute inflammatory changes seen about the bowels. Vascular/Lymphatic: Normal intravascular enhancement seen throughout the intra-abdominal aorta. Mesenteric vessels patent proximally. No adenopathy. Reproductive: Prostate normal. Other: No other free fluid within the abdomen and pelvis. No free intraperitoneal air. Musculoskeletal: Few remotely healed right-sided rib fractures noted. No acute osseous abnormality. No discrete lytic or blastic osseous lesions. 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 IMPRESSION: 1. Right inguinal hernia containing a short segment of small bowel with associated small bowel obstruction as above. 2. No other acute intra-abdominal or pelvic process. Electronically Signed   By: Jeannine Boga M.D.   On: 03/27/2019 03:53     .Adin Hector, M.D., F.A.C.S. Gastrointestinal and Minimally Invasive Surgery [facility] [address], [place], [place] [address] [phone] Main / Paging  03/27/2019 8:10 AM    Adin Hector

## 2019-03-27 NOTE — Op Note (Signed)
03/27/2019  10:13 AM  PATIENT:  Charles Deleon  43 y.o. male  Patient Care Team: Patient, No Pcp Per as PCP - General (General Practice)  PRE-OPERATIVE DIAGNOSIS:  INCARCERATED RIGHT INGUINAL HERNIA   POST-OPERATIVE DIAGNOSIS:   INCARCERATED RIGHT INGUINAL HERNIA LEFT FEMORAL HERNIA  PROCEDURE:   DIAGNOSTIC LAPAROSCOPY LAPAROSCOPIC RIGHT INGUINAL AND LEFT FEMORAL HERNIA REPAIRS WITH MESH  BILATERAL TAP BLOCKS INSERTION OF MESH  SURGEON:  Adin Hector, MD  ASSISTANT: None  ANESTHESIA:     Regional ilioinguinal and genitofemoral and spermatic cord nerve blocks  General  Nerve block provided with liposomal bupivacaine (Experel) mixed with 0.25% bupivacaine as a Bilateral TAP block x 33mL each side at the level of the transverse abdominis & preperitoneal spaces along the flank at the anterior axillary line, from subcostal ridge to iliac crest under laparoscopic guidance    EBL:  No intake/output data recorded..  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  INDICATION: Pleasant active male with intermittent right groin swelling for the past decade.  Suspected hernia.  Reducible.  Required some occasional reduction.  However he had an episode where it popped out and cannot be reduced.  Increasing pain and swelling.  Examination concerning for incarcerated inguinal hernia.  CT scan confirmed a knuckle of small bowel within it starting to cause a small bowel obstruction.  Not reducible in the emergency department.  Recommendation made for urgent operative exploration and hernia repair.  Possible small bowel resection.  The anatomy & physiology of the abdominal wall and pelvic floor was  discussed.  The pathophysiology of hernias in the inguinal and pelvic region was discussed.  Natural history risks such as progressive enlargement, pain, incarceration & strangulation was discussed.   Contributors to complications such as smoking, obesity, diabetes, prior surgery, etc were discussed.    I feel the risks of no intervention will lead to serious problems that outweigh the operative risks; therefore, I recommended surgery to reduce and repair the hernia.  I explained laparoscopic techniques with possible need for an open approach.  I noted usual use of mesh to patch and/or buttress hernia repair  Risks such as bleeding, infection, abscess, need for further treatment, heart attack, death, and other risks were discussed.  I noted a good likelihood this will help address the problem.   Goals of post-operative recovery were discussed as well.  Possibility that this will not correct all symptoms was explained.  I stressed the importance of low-impact activity, aggressive pain control, avoiding constipation, & not pushing through pain to minimize risk of post-operative chronic pain or injury.  Possible need for small bowel resection if evidence of any ischemia or perforation.  Possibility of suture repair or absorbable mesh with suboptimal hernia recurrence rate but low risk of infection needed versus use of permanent mesh if no evidence of any infection or inflammation or necrosis.  Recommendation made for diagnostic laparoscopy to rule out hernia on contralateral side.  He notes some occasional discomfort there but not to the level on the right.  We will work to minimize complications.     An educational handout further explaining the pathology & treatment options was given as well.  Questions were answered.  The patient expresses understanding & wishes to proceed with surgery.  OR FINDINGS: Patient had indirect greater than indirect pantaloon type inguinal hernia on the right side.  Small bowel  reduced without any evidence of necrosis or perforation.  No cellulitis or abscess.  Mild laxity at the femoral and obturator canals but no true hernias there.  On the left side some laxity at the left internal ring but no true indirect inguinal hernia.  No direct space hernia.  Obvious left femoral hernia.  No obturator hernia.  DESCRIPTION:  The patient was identified & brought into the operating room. The patient was positioned supine with arms tucked. SCDs were active during the entire case. The patient underwent general anesthesia without any difficulty.  The abdomen was prepped and draped in a sterile fashion.   A Surgical Timeout confirmed our plan.  The patient's hernia had self reduced by the time of the abdomen and groins being prepped.  I made a transverse incision through the inferior umbilical fold.  I made a small transverse nick through the anterior rectus fascia contralateral to the inguinal hernia side and placed a 0-vicryl stitch through the fascia.  I placed a Hasson trocar into the preperitoneal plane.  Entry was clean.  We induced carbon dioxide insufflation. Camera inspection revealed no injury.  I used a 49mm angled scope to bluntly free the peritoneum off the infraumbilical anterior abdominal wall.  I created enough of a preperitoneal pocket to place 26mm ports into the right & left mid-abdomen into this preperitoneal cavity.  I focused attention on the RIGHT pelvis since that was the dominant hernia side.   I used blunt & focused sharp dissection to free the peritoneum off the flank and down to the pubic rim.  I freed the anteriolateral bladder wall off the anteriolateral pelvic wall, sparing midline attachments.   I located a swath of peritoneum going into a hernia fascial defect at the  direct space consistent with  a direct space inguinal hernia.   Also hernia sac going up into a dilated internal ring consistent with an indirect inguinal hernia.  Therefore a pantaloon type  hernia.  About already been reduced.  The indirect hernia was greater than the direct space hernia..  I gradually freed the peritoneal hernia sac off safely and reduced it into the preperitoneal space.  I freed the peritoneum off the spermatic vessels & vas deferens.  I freed peritoneum off the retroperitoneum along the psoas muscle.  Patient did not have much in the way of any spermatic cord lipomas requiring dissection or removal.  I did open up the hernia sac and ran the small bowel.  There is some adhesions to the peritoneum in the midline but there is no evidence of any ischemia.  Appendix not inflamed.  Cecum normal as well.  No evidence of any perforation necrosis nor ischemia.  No evidence of any cellulitis or major peritonitis or inflammation.  Hemostasis good.  I closed the peritoneal defect and thus did a hernia sac high ligation using 2-0 Vicryl intracorporeal running suture laparoscopically.  Hemostasis was good.    I turned attention on the opposite  LEFT pelvis.  Patient had obvious left femoral hernia.  I did dissection in a similar, mirror-image fashion. The patient had a femoral hernia.   Some laxity in the internal ring but no definite indirect inguinal hernia.  However the patient did have a rather large spermatic cord lipoma.  Spermatic cord lipoma was  dissected away & removed.  No direct space nor obturator hernias.    I checked & assured hemostasis.    Because there was no evidence of cellulitis, peritonitis, ischemia, perforation, necrosis; I felt it was reasonable to go ahead and repair with standard mesh.  My partner, Dr. Lucia Gaskins, came in and agreed as well.  I chose 15x15 cm sheets of ultra-lightweight polypropylene mesh (Ultrapro), one for each side.  I cut a single sigmoid-shaped slit ~6cm from a corner of each mesh.  I placed the meshes into the preperitoneal space & laid them as overlapping diamonds such that at the inferior points, a 6x6 cm corner flap rested in the true  anterolateral pelvis, covering the obturator & femoral foramina.   I allowed the bladder to return to the pubis, this helping tuck the corners of the mesh in the anteriolateral pelvis.  The medial corners overlapped each other across midline cephalad to the pubic rim.   Given the numerous hernias of moderate size, I placed a third 15x15cm mesh in the center as a vertical diamond.  The lateral wings of the mesh overlap across the direct spaces and internal rings where the dominant hernias were.  This provided good coverage and reinforcement of the hernia repairs.  Because of the central mesh placement with good overlap, I did not place any tacks.   I held the hernia sacs cephalad & evacuated carbon dioxide.  I closed the fascia with absorbable suture.  I closed the skin using 4-0 monocryl stitch.  Sterile dressings were applied.   The patient was extubated & arrived in the PACU in stable condition..  I had discussed postoperative care with the patient in the holding area.  Instructions are written in the chart.  I made an attempt to reach his mother, Shankar Silber [phone], but there was no answer.  Because there was no major obstruction nor necrosis, he could potentially leave later today as he hoped.  We will see.  May need to keep him overnight until family can be reached & disposition planning made.  I will tentatively write orders to allow him to leave later this evening if he is feeling better and family can be reached.  Would plan outpatient follow-up with telephone postoperative call with [facility] given the COVID-19 pandemic.  Adin Hector, M.D., F.A.C.S. Gastrointestinal and Minimally Invasive Surgery [facility] [address] [place],  [address] [phone] Main / Paging  03/27/2019 10:13 AM

## 2019-03-27 NOTE — Anesthesia Preprocedure Evaluation (Addendum)
Anesthesia Evaluation  Patient identified by MRN, date of birth, ID band Patient awake    Reviewed: Allergy & Precautions, H&P , NPO status , Patient's Chart, lab work & pertinent test results  Airway Mallampati: II  TM Distance: >3 FB Neck ROM: Full    Dental no notable dental hx. (+) Teeth Intact, Dental Advisory Given   Pulmonary neg pulmonary ROS, former smoker,    Pulmonary exam normal breath sounds clear to auscultation       Cardiovascular negative cardio ROS   Rhythm:Regular Rate:Normal     Neuro/Psych negative neurological ROS  negative psych ROS   GI/Hepatic negative GI ROS, Neg liver ROS,   Endo/Other  negative endocrine ROS  Renal/GU negative Renal ROS  negative genitourinary   Musculoskeletal   Abdominal   Peds  Hematology negative hematology ROS (+)   Anesthesia Other Findings   Reproductive/Obstetrics negative OB ROS                            Anesthesia Physical Anesthesia Plan  ASA: I  Anesthesia Plan: General   Post-op Pain Management:    Induction: Intravenous  PONV Risk Score and Plan: 3 and Ondansetron, Dexamethasone and Midazolam  Airway Management Planned: Oral ETT  Additional Equipment:   Intra-op Plan:   Post-operative Plan: Extubation in OR  Informed Consent: I have reviewed the patients History and Physical, chart, labs and discussed the procedure including the risks, benefits and alternatives for the proposed anesthesia with the patient or authorized representative who has indicated his/her understanding and acceptance.     Dental advisory given  Plan Discussed with: CRNA  Anesthesia Plan Comments:         Anesthesia Quick Evaluation

## 2019-03-27 NOTE — Plan of Care (Signed)
  Problem: Education: Goal: Knowledge of General Education information will improve Description Including pain rating scale, medication(s)/side effects and non-pharmacologic comfort measures 03/27/2019 1905 by Audrea Muscat, RN Outcome: Adequate for Discharge 03/27/2019 1616 by Audrea Muscat, RN Outcome: Progressing   Problem: Health Behavior/Discharge Planning: Goal: Ability to manage health-related needs will improve 03/27/2019 1905 by Audrea Muscat, RN Outcome: Adequate for Discharge 03/27/2019 1616 by Audrea Muscat, RN Outcome: Progressing   Problem: Clinical Measurements: Goal: Ability to maintain clinical measurements within normal limits will improve 03/27/2019 1905 by Audrea Muscat, RN Outcome: Adequate for Discharge 03/27/2019 1616 by Audrea Muscat, RN Outcome: Progressing Goal: Will remain free from infection 03/27/2019 1905 by Audrea Muscat, RN Outcome: Adequate for Discharge 03/27/2019 1616 by Audrea Muscat, RN Outcome: Progressing Goal: Diagnostic test results will improve 03/27/2019 1905 by Audrea Muscat, RN Outcome: Adequate for Discharge 03/27/2019 1616 by Audrea Muscat, RN Outcome: Progressing Goal: Respiratory complications will improve 03/27/2019 1905 by Audrea Muscat, RN Outcome: Adequate for Discharge 03/27/2019 1616 by Audrea Muscat, RN Outcome: Progressing Goal: Cardiovascular complication will be avoided 03/27/2019 1905 by Audrea Muscat, RN Outcome: Adequate for Discharge 03/27/2019 1616 by Audrea Muscat, RN Outcome: Progressing   Problem: Activity: Goal: Risk for activity intolerance will decrease 03/27/2019 1905 by Audrea Muscat, RN Outcome: Adequate for Discharge 03/27/2019 1616 by Audrea Muscat, RN Outcome: Progressing   Problem: Nutrition: Goal: Adequate nutrition will be maintained 03/27/2019 1905 by Audrea Muscat, RN Outcome: Adequate for Discharge 03/27/2019 1616 by Audrea Muscat, RN Outcome:  Progressing   Problem: Coping: Goal: Level of anxiety will decrease 03/27/2019 1905 by Audrea Muscat, RN Outcome: Adequate for Discharge 03/27/2019 1616 by Audrea Muscat, RN Outcome: Progressing   Problem: Elimination: Goal: Will not experience complications related to bowel motility 03/27/2019 1905 by Audrea Muscat, RN Outcome: Adequate for Discharge 03/27/2019 1616 by Audrea Muscat, RN Outcome: Progressing Goal: Will not experience complications related to urinary retention 03/27/2019 1905 by Audrea Muscat, RN Outcome: Adequate for Discharge 03/27/2019 1616 by Audrea Muscat, RN Outcome: Progressing   Problem: Pain Managment: Goal: General experience of comfort will improve 03/27/2019 1905 by Audrea Muscat, RN Outcome: Adequate for Discharge 03/27/2019 1616 by Audrea Muscat, RN Outcome: Progressing   Problem: Safety: Goal: Ability to remain free from injury will improve 03/27/2019 1905 by Audrea Muscat, RN Outcome: Adequate for Discharge 03/27/2019 1616 by Audrea Muscat, RN Outcome: Progressing   Problem: Skin Integrity: Goal: Risk for impaired skin integrity will decrease 03/27/2019 1905 by Audrea Muscat, RN Outcome: Adequate for Discharge 03/27/2019 1616 by Audrea Muscat, RN Outcome: Progressing  Home with family. Instructions given

## 2019-03-27 NOTE — ED Notes (Signed)
Pt resting in bed with eyes closed. Reports significant relief from discomfort. Waiting to speak to surgery. No acute distress

## 2019-03-27 NOTE — Plan of Care (Signed)
  Problem: Education: Goal: Knowledge of General Education information will improve Description Including pain rating scale, medication(s)/side effects and non-pharmacologic comfort measures Outcome: Progressing   Problem: Health Behavior/Discharge Planning: Goal: Ability to manage health-related needs will improve Outcome: Progressing   Problem: Clinical Measurements: Goal: Ability to maintain clinical measurements within normal limits will improve Outcome: Progressing Goal: Will remain free from infection Outcome: Progressing Goal: Diagnostic test results will improve Outcome: Progressing Goal: Respiratory complications will improve Outcome: Progressing Goal: Cardiovascular complication will be avoided Outcome: Progressing   Problem: Activity: Goal: Risk for activity intolerance will decrease Outcome: Progressing   Problem: Nutrition: Goal: Adequate nutrition will be maintained Outcome: Progressing   Problem: Coping: Goal: Level of anxiety will decrease Outcome: Progressing   Problem: Elimination: Goal: Will not experience complications related to bowel motility Outcome: Progressing Goal: Will not experience complications related to urinary retention Outcome: Progressing   Problem: Pain Managment: Goal: General experience of comfort will improve Outcome: Progressing   Problem: Safety: Goal: Ability to remain free from injury will improve Outcome: Progressing   Problem: Skin Integrity: Goal: Risk for impaired skin integrity will decrease Outcome: Progressing    Plan of care discussed with the patient

## 2019-03-27 NOTE — ED Notes (Signed)
Surgery at bedside consulting with pt. No acute distress.

## 2019-03-27 NOTE — Anesthesia Postprocedure Evaluation (Signed)
Anesthesia Post Note  Patient: Charles Deleon  Procedure(s) Performed: DIAGNOSTIC LAPAROSCOPY LAPAROSCOPIC RIGHT INGUINAL AND LEFT FEMORALHERNIA REPAIRS WITH MESH BILATERAL TAP BLOCKS (Right Abdomen) INSERTION OF MESH (Bilateral Groin)     Patient location during evaluation: PACU Anesthesia Type: General Level of consciousness: awake and alert Pain management: pain level controlled Vital Signs Assessment: post-procedure vital signs reviewed and stable Respiratory status: spontaneous breathing, nonlabored ventilation, respiratory function stable and patient connected to nasal cannula oxygen Cardiovascular status: blood pressure returned to baseline and stable Postop Assessment: no apparent nausea or vomiting Anesthetic complications: no    Last Vitals:  Vitals:   03/27/19 1022 03/27/19 1045  BP: (!) 159/87 (!) 141/77  Pulse: (!) 101 76  Resp: 11 14  Temp: 36.7 C 36.7 C  SpO2: 100% 100%    Last Pain:  Vitals:   03/27/19 1045  TempSrc:   PainSc: 2                  Yena Tisby,W. EDMOND

## 2019-03-27 NOTE — Transfer of Care (Signed)
Immediate Anesthesia Transfer of Care Note  Patient: Charles Deleon  Procedure(s) Performed: DIAGNOSTIC LAPAROSCOPY LAPAROSCOPIC RIGHT INGUINAL AND LEFT FEMORALHERNIA REPAIRS WITH MESH BILATERAL TAP BLOCKS (Right Abdomen) INSERTION OF MESH (Bilateral Groin)  Patient Location: PACU  Anesthesia Type:General  Level of Consciousness: awake and alert   Airway & Oxygen Therapy: Patient Spontanous Breathing and Patient connected to face mask oxygen  Post-op Assessment: Report given to RN and Post -op Vital signs reviewed and stable  Post vital signs: Reviewed and stable  Last Vitals:  Vitals Value Taken Time  BP 159/87 03/27/2019 10:22 AM  Temp    Pulse 100 03/27/2019 10:23 AM  Resp 11 03/27/2019 10:23 AM  SpO2 100 % 03/27/2019 10:23 AM  Vitals shown include unvalidated device data.  Last Pain:  Vitals:   03/27/19 0751  TempSrc:   PainSc: 3          Complications: No apparent anesthesia complications

## 2019-03-27 NOTE — H&P (Signed)
Charles Deleon is an 43 y.o. male.   Chief Complaint: incarcerated RIH HPI:  Patient is a 43 year old male who presented to the ER with complaints of right inguinal hernia that was stuck in his groin and swollen and severely painful.  He has had a hernia for 9 years.  Typically it will pop out and go back in easily.  It has had a few times where it has stuck but then when he rests or lays down it will go back in.  This time it did not and got more swollen and painful.  He denies any nausea or vomiting.  The pain actually has improved and since coming to the emergency department the swelling has reduced by about half or more.  He denies fevers and chills.  He has not had any surgery before.  Of note the patient is at high risk for colorectal cancer.  He works in Biomedical scientist.  He stopped drinking around 2 months ago.    History reviewed. No pertinent past medical history.  History reviewed. No pertinent surgical history.  Family History  Problem Relation Age of Onset  . Cancer Mother    Social History:  reports that he has quit smoking. He has never used smokeless tobacco. He reports previous alcohol use. He reports current drug use. Drug: Marijuana.  Allergies: No Known Allergies  Meds:   Current Meds  Medication Sig  . acidophilus (RISAQUAD) CAPS capsule Take 1 capsule by mouth daily.  Marland Kitchen ibuprofen (ADVIL,MOTRIN) 200 MG tablet Take 800 mg by mouth every 6 (six) hours as needed for moderate pain.  . Multiple Vitamin (MULTIVITAMIN WITH MINERALS) TABS tablet Take 1 tablet by mouth daily.     Results for orders placed or performed during the hospital encounter of 03/27/19 (from the past 48 hour(s))  CBC with Differential/Platelet     Status: Abnormal   Collection Time: 03/27/19  2:15 AM  Result Value Ref Range   WBC 12.0 (H) 4.0 - 10.5 K/uL   RBC 3.92 (L) 4.22 - 5.81 MIL/uL   Hemoglobin 13.5 13.0 - 17.0 g/dL   HCT 40.1 39.0 - 52.0 %   MCV 102.3 (H) 80.0 - 100.0 fL   MCH 34.4 (H)  26.0 - 34.0 pg   MCHC 33.7 30.0 - 36.0 g/dL   RDW 11.6 11.5 - 15.5 %   Platelets 238 150 - 400 K/uL   nRBC 0.0 0.0 - 0.2 %   Neutrophils Relative % 89 %   Neutro Abs 10.5 (H) 1.7 - 7.7 K/uL   Lymphocytes Relative 6 %   Lymphs Abs 0.8 0.7 - 4.0 K/uL   Monocytes Relative 5 %   Monocytes Absolute 0.6 0.1 - 1.0 K/uL   Eosinophils Relative 0 %   Eosinophils Absolute 0.0 0.0 - 0.5 K/uL   Basophils Relative 0 %   Basophils Absolute 0.0 0.0 - 0.1 K/uL   Immature Granulocytes 0 %   Abs Immature Granulocytes 0.05 0.00 - 0.07 K/uL    Comment: Performed at [facility], [place] [address] [place], [place] [address]  Basic metabolic panel     Status: Abnormal   Collection Time: 03/27/19  2:15 AM  Result Value Ref Range   Sodium 140 135 - 145 mmol/L   Potassium 3.8 3.5 - 5.1 mmol/L   Chloride 104 98 - 111 mmol/L   CO2 24 22 - 32 mmol/L   Glucose, Bld 155 (H) 70 - 99 mg/dL   BUN 19 6 - 20  mg/dL   Creatinine, Ser 0.74 0.61 - 1.24 mg/dL   Calcium 9.1 8.9 - 10.3 mg/dL   GFR calc non Af Amer >60 >60 mL/min   GFR calc Af Amer >60 >60 mL/min   Anion gap 12 5 - 15    Comment: Performed at [facility], [place] [address] [place], [place] [address]  Lactic acid, plasma     Status: None   Collection Time: 03/27/19  2:15 AM  Result Value Ref Range   Lactic Acid, Venous 0.9 0.5 - 1.9 mmol/L    Comment: Performed at [facility], [place] [address], [place], [place] [address]   Ct Abdomen Pelvis W Contrast  Result Date: 03/27/2019 CLINICAL DATA:  Mild scattered subsegmental atelectatic changes seen dependently within the visualized lung bases. Visualized lungs are otherwise clear. EXAM: CT ABDOMEN AND PELVIS WITH CONTRAST TECHNIQUE: Multidetector CT imaging of the abdomen and pelvis was performed using the standard protocol following bolus administration of intravenous contrast. CONTRAST:  113mL OMNIPAQUE IOHEXOL 300 MG/ML  SOLN COMPARISON:  Prior CT from  07/18/2011. FINDINGS: Lower chest: Mild scattered subsegmental atelectatic changes seen within the lung bases. Visualized lungs are otherwise clear. Hepatobiliary: Liver demonstrates a normal contrast enhanced appearance. Gallbladder within normal limits. No biliary dilatation. Pancreas: Pancreas within normal limits. Spleen: Spleen within normal limits. Adrenals/Urinary Tract: Adrenal glands are normal. Kidneys equal in size with symmetric enhancement. 12 mm cyst present within the interpolar right kidney. No nephrolithiasis, hydronephrosis, or focal enhancing renal mass. No hydroureter. Partially distended bladder within normal limits. Stomach/Bowel: Stomach within normal limits. Right inguinal hernia containing a short segment loop of small bowel (series 2, image 72). Associated free fluid present within the hernia sac, likely reactive. Small bowel mildly dilated proximally with associated air-fluid level, suggesting associated obstruction. Small bowel is decompressed distal to the hernia. Colon largely decompressed. Normal appendix. No other acute inflammatory changes seen about the bowels. Vascular/Lymphatic: Normal intravascular enhancement seen throughout the intra-abdominal aorta. Mesenteric vessels patent proximally. No adenopathy. Reproductive: Prostate normal. Other: No other free fluid within the abdomen and pelvis. No free intraperitoneal air. Musculoskeletal: Few remotely healed right-sided rib fractures noted. No acute osseous abnormality. No discrete lytic or blastic osseous lesions. 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 IMPRESSION: 1. Right inguinal hernia containing a short segment of small bowel with associated small bowel obstruction as above. 2. No other acute intra-abdominal or pelvic process. Electronically Signed   By: Jeannine Boga M.D.   On: 03/27/2019 03:53    Review of Systems  Constitutional: Negative.   HENT: Negative.   Eyes:  Negative.   Respiratory: Negative.   Cardiovascular: Negative.   Gastrointestinal: Negative.   Genitourinary:       Right groin pain and swelling  Musculoskeletal: Negative.   Skin: Negative.   Neurological: Negative.   Endo/Heme/Allergies: Negative.   Psychiatric/Behavioral: Negative.   All other systems reviewed and are negative.   Blood pressure 119/63, pulse (!) 59, temperature 97.9 F (36.6 C), temperature source Oral, resp. rate 16, height 5\' 10"  (1.778 m), weight 70.3 kg, SpO2 97 %. Physical Exam  Constitutional: He is oriented to person, place, and time. He appears well-developed and well-nourished. No distress.  HENT:  Head: Normocephalic and atraumatic.  Right Ear: External ear normal.  Left Ear: External ear normal.  Healed scar over right medial eyebrow.  Eyes: Pupils are equal, round, and reactive to light. Conjunctivae are normal. No scleral icterus.  Neck: Normal  range of motion. Neck supple. No tracheal deviation present.  Cardiovascular: Normal rate and regular rhythm.  Respiratory: Effort normal. No respiratory distress.  GI: Soft. He exhibits no distension. There is no abdominal tenderness. There is no rebound and no guarding. A hernia is present. Hernia confirmed positive in the right inguinal area.  Genitourinary:    Penis normal.      Musculoskeletal: Normal range of motion.        General: No tenderness, deformity or edema.  Neurological: He is alert and oriented to person, place, and time.  Skin: Skin is warm and dry. No rash noted. He is not diaphoretic. No erythema. No pallor.  Psychiatric: He has a normal mood and affect. His behavior is normal. Judgment and thought content normal.     Assessment/Plan Incarcerated RIH with bowel.   Radiologic bowel obstruction, though no clinical signs of obstruction.    Will need operative repair.   Dr. Johney Maine to do.  Will discuss with him lap vs open.    Briefly discussed with patient.   Will need to have no  heavy lifting or strenuous activity post op.     Stark Klein, MD 03/27/2019, 6:56 AM

## 2019-03-27 NOTE — ED Notes (Signed)
Pt resting in bed. Awaiting CT results. Meds given per MAR. NAD

## 2019-03-30 ENCOUNTER — Encounter (HOSPITAL_COMMUNITY): Payer: Self-pay | Admitting: Surgery

## 2019-06-29 IMAGING — CT CT ABDOMEN AND PELVIS WITH CONTRAST
2 of 5 series · 15 of 46 positions shown, 17 images · IV contrast (omnipaque)
Comparison: Prior CT from 07/18/2011.

CLINICAL DATA: Mild scattered subsegmental atelectatic changes seen
dependently within the visualized lung bases. Visualized lungs are
otherwise clear.

EXAM:
CT ABDOMEN AND PELVIS WITH CONTRAST
TECHNIQUE: Multidetector CT imaging of the abdomen and pelvis was performed
using the standard protocol following bolus administration of
intravenous contrast.
CONTRAST:  100mL OMNIPAQUE IOHEXOL 300 MG/ML  SOLN

[Series 2: axial st · axial · 0.70mm/px · z∈[-247,+153]mm · 12 of 92 slices shown, 14 images]
[im 6/92  soft-tissue]
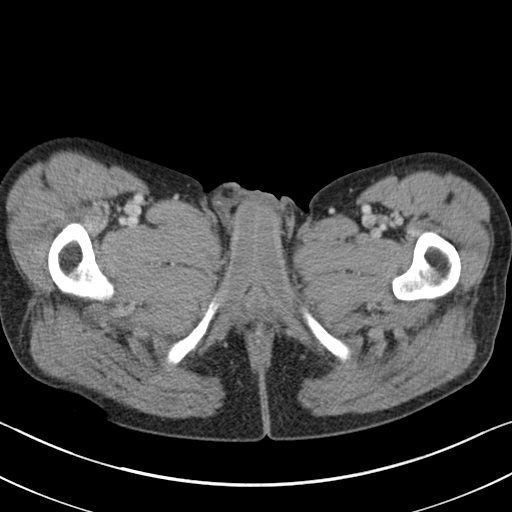
[im 6/92  bone]
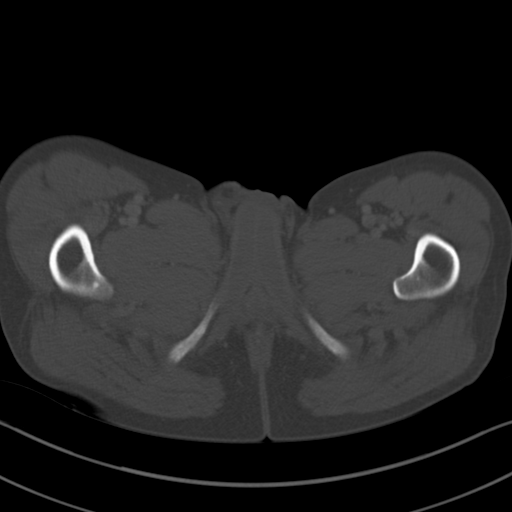
[im 17/92  soft-tissue]
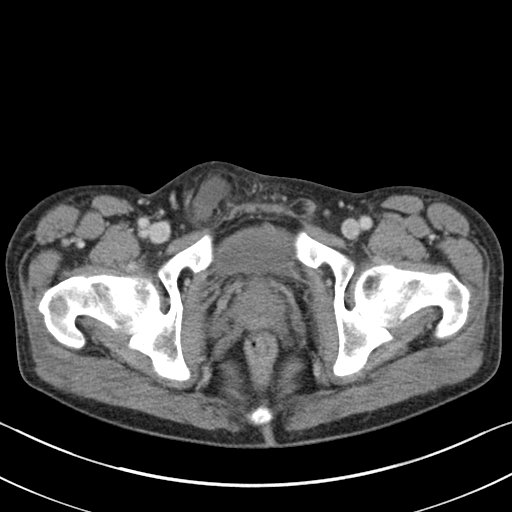
[im 22/92  soft-tissue]
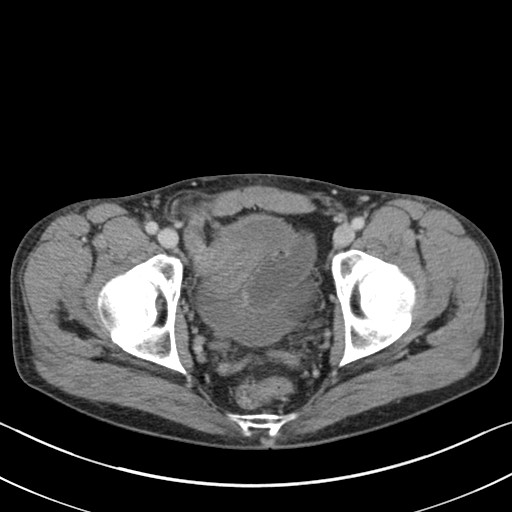
[im 27/92  soft-tissue]
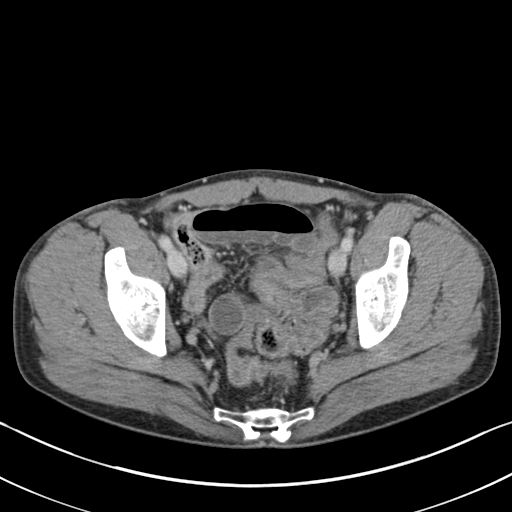
[im 38/92  soft-tissue]
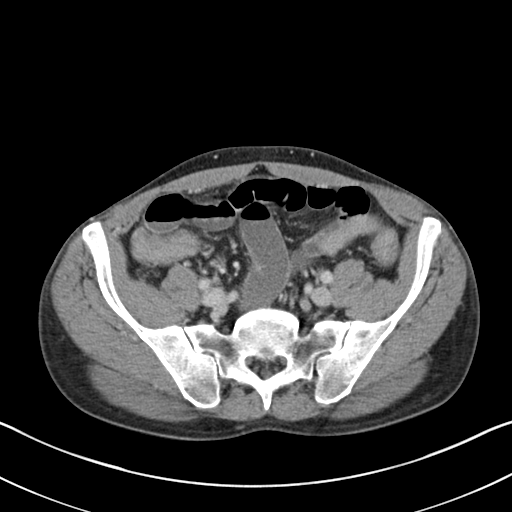
[im 43/92  soft-tissue]
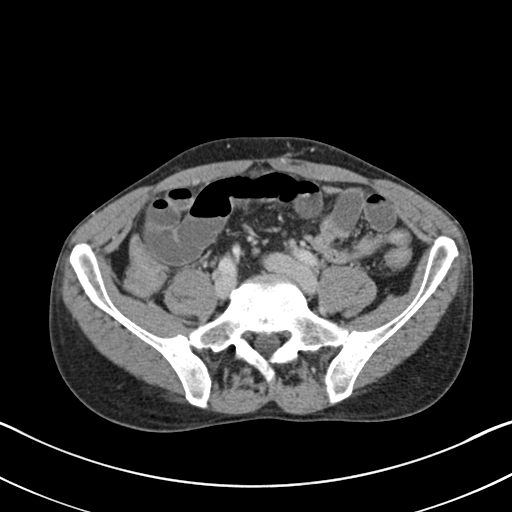
[im 49/92  soft-tissue]
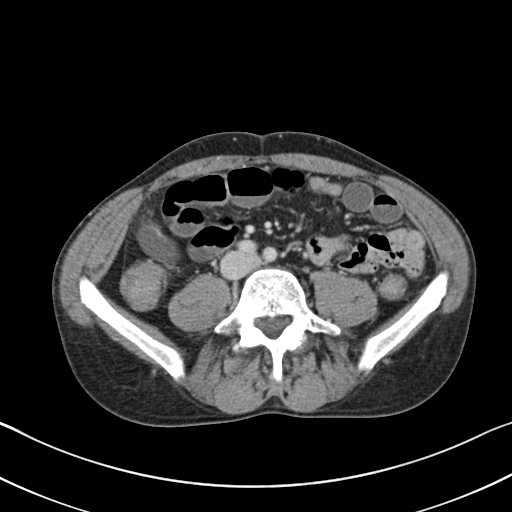
[im 59/92  soft-tissue]
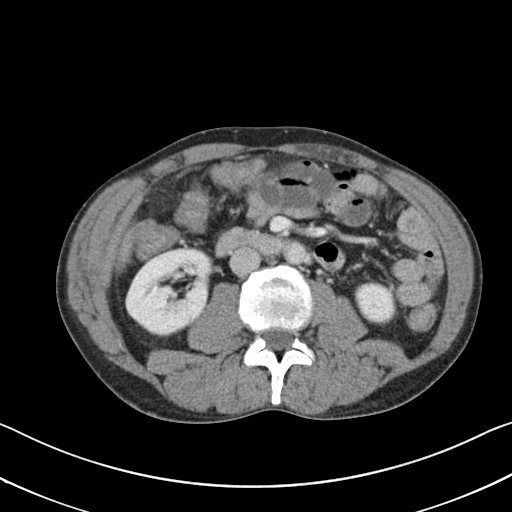
[im 65/92  soft-tissue]
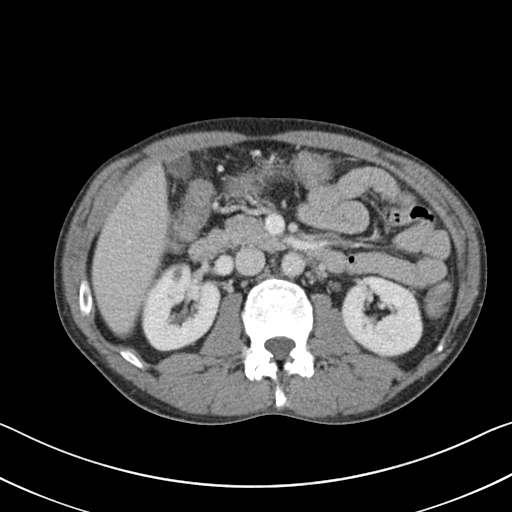
[im 65/92  bone]
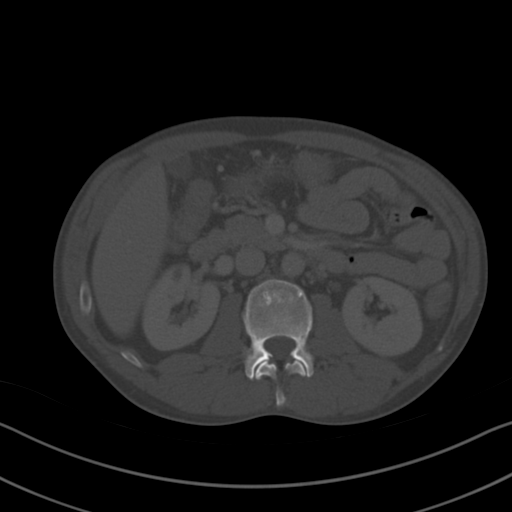
[im 70/92  soft-tissue]
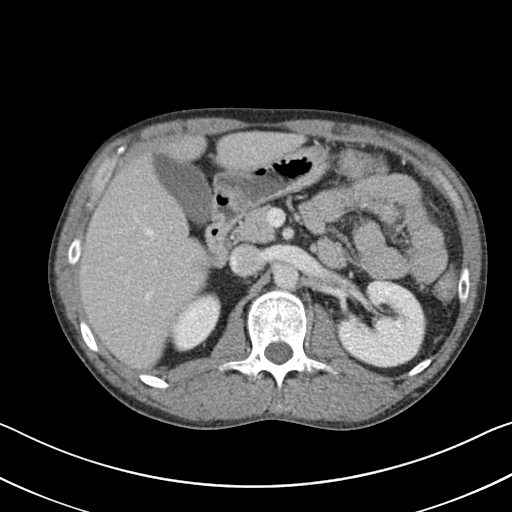
[im 81/92  soft-tissue]
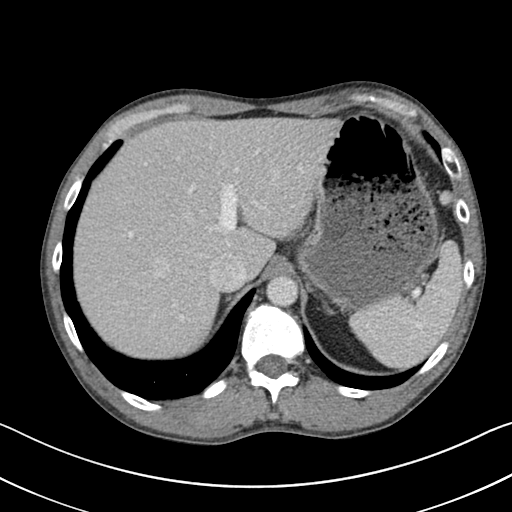
[im 86/92  soft-tissue]
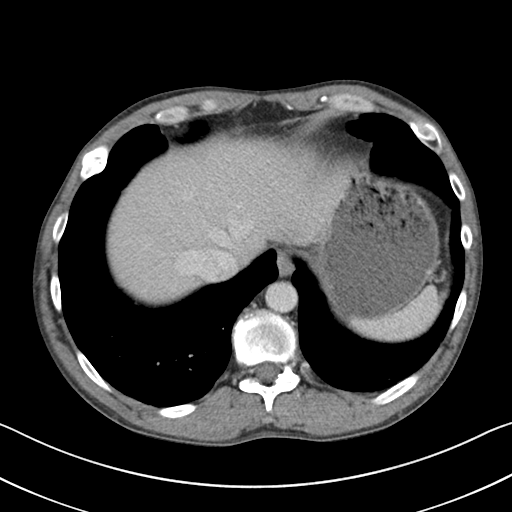

[Series 5: coronal st · coronal · 0.69mm/px · 3 of 123 slices shown]
[im 41/123  soft-tissue]
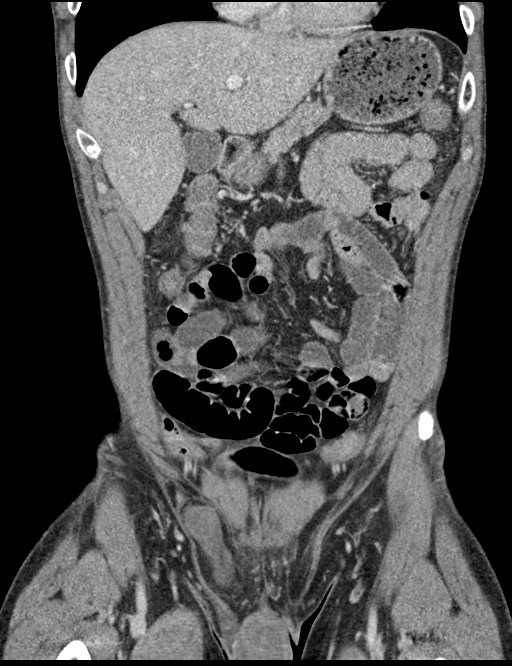
[im 55/123  soft-tissue]
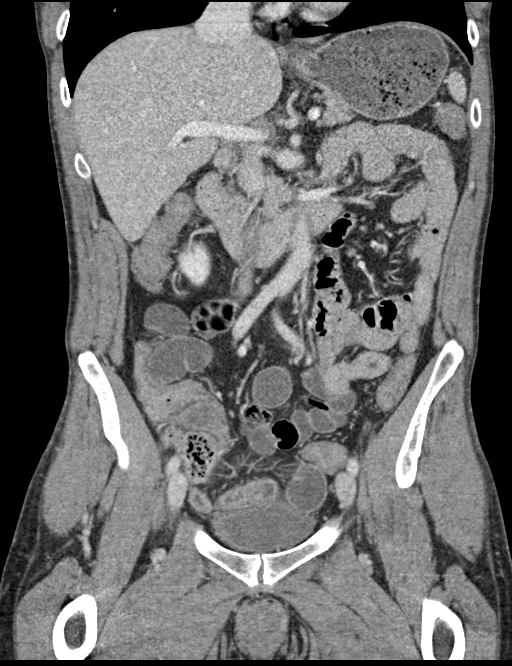
[im 68/123  soft-tissue]
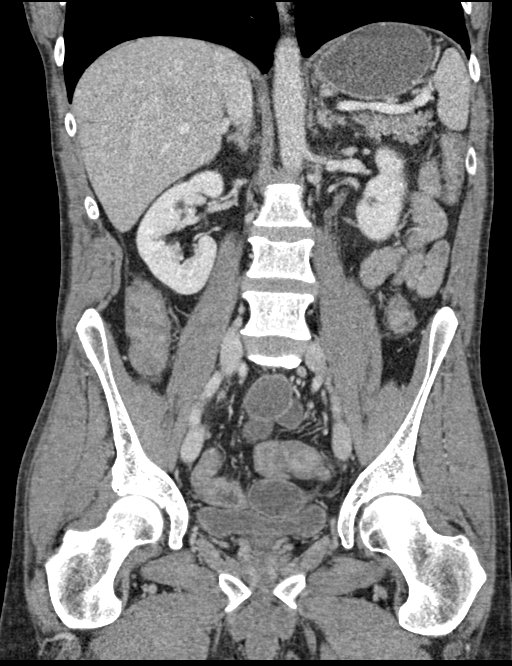

[15 of 46 positions shown; findings below may reference images not displayed]

FINDINGS: Lower chest: Mild scattered subsegmental atelectatic changes seen
within the lung bases. Visualized lungs are otherwise clear.

Hepatobiliary: Liver demonstrates a normal contrast enhanced
appearance. Gallbladder within normal limits. No biliary dilatation.

Pancreas: Pancreas within normal limits.

Spleen: Spleen within normal limits.

Adrenals/Urinary Tract: Adrenal glands are normal. Kidneys equal in
size with symmetric enhancement. 12 mm cyst present within the
interpolar right kidney. No nephrolithiasis, hydronephrosis, or
focal enhancing renal mass. No hydroureter. Partially distended
bladder within normal limits.

Stomach/Bowel: Stomach within normal limits. Right inguinal hernia
containing a short segment loop of small bowel (series 2, image 72).
Associated free fluid present within the hernia sac, likely
reactive. Small bowel mildly dilated proximally with associated
air-fluid level, suggesting associated obstruction. Small bowel is
decompressed distal to the hernia. Colon largely decompressed.
Normal appendix. No other acute inflammatory changes seen about the
bowels.

Vascular/Lymphatic: Normal intravascular enhancement seen throughout
the intra-abdominal aorta. Mesenteric vessels patent proximally. No
adenopathy.

Reproductive: Prostate normal.

Other: No other free fluid within the abdomen and pelvis. No free
intraperitoneal air.

Musculoskeletal: Few remotely healed right-sided rib fractures
noted. No acute osseous abnormality. No discrete lytic or blastic
osseous lesions. Unilateral left-sided pars defect noted at L5
without associated spondylolisthesis.
IMPRESSION: 1. Right inguinal hernia containing a short segment of small bowel
with associated small bowel obstruction as above.
2. No other acute intra-abdominal or pelvic process.

## 2024-11-19 ENCOUNTER — Encounter (HOSPITAL_COMMUNITY): Payer: Self-pay | Admitting: Surgery
